# Patient Record
Sex: Female | Born: 1965 | Race: White | Hispanic: No | Marital: Married | Smoking: Current every day smoker
Health system: Southern US, Community
[De-identification: ages and names within clinical notes are randomized; demographics above are authoritative.]

## PROBLEM LIST (undated history)

## (undated) DIAGNOSIS — I10 Essential (primary) hypertension: Secondary | ICD-10-CM

## (undated) HISTORY — PX: HYSTERECTOMY ABDOMINAL WITH SALPINGECTOMY: SHX6725

---

## 2001-01-13 ENCOUNTER — Ambulatory Visit (HOSPITAL_COMMUNITY): Admission: RE | Admit: 2001-01-13 | Discharge: 2001-01-14 | Payer: Self-pay | Admitting: Neurosurgery

## 2001-01-13 ENCOUNTER — Encounter: Payer: Self-pay | Admitting: Neurosurgery

## 2001-05-29 ENCOUNTER — Encounter: Admission: RE | Admit: 2001-05-29 | Discharge: 2001-05-29 | Payer: Self-pay | Admitting: Neurosurgery

## 2001-05-29 ENCOUNTER — Encounter: Payer: Self-pay | Admitting: Neurosurgery

## 2005-05-30 ENCOUNTER — Emergency Department (HOSPITAL_COMMUNITY): Admission: EM | Admit: 2005-05-30 | Discharge: 2005-05-30 | Payer: Self-pay | Admitting: Emergency Medicine

## 2014-03-22 ENCOUNTER — Emergency Department (HOSPITAL_COMMUNITY)
Admission: EM | Admit: 2014-03-22 | Discharge: 2014-03-23 | Disposition: A | Discharge status: Home / Self Care | Payer: BC Managed Care – PPO | Attending: Emergency Medicine | Admitting: Emergency Medicine

## 2014-03-22 ENCOUNTER — Emergency Department (HOSPITAL_COMMUNITY): Payer: BC Managed Care – PPO

## 2014-03-22 DIAGNOSIS — G8929 Other chronic pain: Secondary | ICD-10-CM | POA: Insufficient documentation

## 2014-03-22 DIAGNOSIS — M549 Dorsalgia, unspecified: Secondary | ICD-10-CM | POA: Insufficient documentation

## 2014-03-22 DIAGNOSIS — Z90710 Acquired absence of both cervix and uterus: Secondary | ICD-10-CM | POA: Insufficient documentation

## 2014-03-22 DIAGNOSIS — K5792 Diverticulitis of intestine, part unspecified, without perforation or abscess without bleeding: Secondary | ICD-10-CM

## 2014-03-22 DIAGNOSIS — K5732 Diverticulitis of large intestine without perforation or abscess without bleeding: Secondary | ICD-10-CM | POA: Insufficient documentation

## 2014-03-22 DIAGNOSIS — R300 Dysuria: Secondary | ICD-10-CM | POA: Insufficient documentation

## 2014-03-22 DIAGNOSIS — E119 Type 2 diabetes mellitus without complications: Secondary | ICD-10-CM | POA: Insufficient documentation

## 2014-03-22 DIAGNOSIS — R739 Hyperglycemia, unspecified: Secondary | ICD-10-CM

## 2014-03-22 LAB — CBG MONITORING, ED: Glucose-Capillary: 422 mg/dL — ABNORMAL HIGH (ref 70–99)

## 2014-03-22 MED ORDER — INSULIN ASPART 100 UNIT/ML ~~LOC~~ SOLN
8.0000 [IU] | Freq: Once | SUBCUTANEOUS | Status: AC
  Administered 2014-03-22: 8 [IU] via SUBCUTANEOUS
  Filled 2014-03-22: qty 1

## 2014-03-22 MED ORDER — SODIUM CHLORIDE 0.9 % IV SOLN
Freq: Once | INTRAVENOUS | Status: AC
  Administered 2014-03-22: 1000 mL via INTRAVENOUS

## 2014-03-22 NOTE — ED Notes (Signed)
Low abd pain with radiation to low back for 3 days. Seen at urgent care and was sent in here for high blood sugar

## 2014-03-22 NOTE — ED Provider Notes (Signed)
CSN: [id number]     Arrival date & time 03/22/14  2233 History   This [id number] was scribed for Geoffery Lyonsouglas Shabria Egley, MD by Leona CarryG. Clay Sherrill, ED Scribe. The patient was seen in [facility]. The patient's care was started at 11:21 PM.     Chief Complaint  Patient presents with  . Abdominal Pain    Patient is a 48 y.o. female presenting with abdominal pain. The history is provided by the patient. No language interpreter was used.  Abdominal Pain Pain location:  Suprapubic Pain severity:  Moderate Duration:  3 days Timing:  Constant Progression:  Unchanged Chronicity:  New Ineffective treatments:  None tried Associated symptoms: dysuria and fever   Associated symptoms: no diarrhea, no nausea and no vomiting   Risk factors: not elderly   Risk factors comment:  DM  HPI Comments: Laura Bond is a 48 y.o. female with a history of DM and hysterectomy who presents to the Emergency Department complaining of constant, suprapubic abdominal pain beginning three days ago. Patient reports associated dysuria and mild fever for the past two days. She reports that she has also had high blood sugar today. She reports chronic back pain. Patient denies nausea, vomiting, and diarrhea. Patient states that she takes Meformin and Amaryl for her DM.  PCP is Dr. Clarise CruzSeepe.      No past medical history on file. No past surgical history on file. No family history on file. History  Substance Use Topics  . Smoking status: Not on file  . Smokeless tobacco: Not on file  . Alcohol Use: Not on file   OB History   No data available     Review of Systems  Constitutional: Positive for fever.  Gastrointestinal: Positive for abdominal pain. Negative for nausea, vomiting and diarrhea.  Genitourinary: Positive for dysuria.  All other systems reviewed and are negative.     Allergies  Review of patient's allergies indicates no known allergies.  Home Medications   Prior to Admission medications   Not on File   Triage  Vitals: BP 157/84  Pulse 124  Temp(Src) 98.4 F (36.9 C) (Oral)  Resp 16  Ht 5\' 5"  (1.651 m)  Wt 235 lb (106.595 kg)  BMI 39.11 kg/m2  SpO2 99% Physical Exam  Nursing note and vitals reviewed. Constitutional: She is oriented to person, place, and time. She appears well-developed and well-nourished. No distress.  HENT:  Head: Normocephalic and atraumatic.  Eyes: Conjunctivae and EOM are normal.  Neck: Normal range of motion. Neck supple. No tracheal deviation present.  Cardiovascular: Normal rate.   Pulmonary/Chest: Effort normal. No respiratory distress.  Abdominal: Soft. Bowel sounds are normal. There is tenderness (suprapubic). There is no rebound and no guarding.  TTP in the suprapubic region and left flank.   Musculoskeletal: Normal range of motion.  Neurological: She is alert and oriented to person, place, and time.  Skin: Skin is warm and dry.  Psychiatric: She has a normal mood and affect. Her behavior is normal.    ED Course  Procedures (including critical care time) DIAGNOSTIC STUDIES: Oxygen Saturation is 99% on room air, normal by my interpretation.    COORDINATION OF CARE: 11:29 PM-Discussed treatment plan which includes abdominal CT and insulin with pt at bedside and pt agreed to plan.     Labs Review Labs Reviewed  CBG MONITORING, ED - Abnormal; Notable for the following:    Glucose-Capillary 422 (*)    All other components within normal limits    Imaging  Review No results found.   EKG Interpretation None      MDM   Final diagnoses:  None    Patient is a 48 year old female who was sent from urgent care for evaluation of lower Donald pain that has been worsening for the past 3 days. She had laboratory studies there which revealed no elevation of WBCs, electrolyte panel which was unremarkable with the exception of elevated glucose, and normal urinalysis. She was sent here for further evaluation of her abdominal pain and treatment of her  hyperglycemia.  Workup reveals diverticulitis on CT scan. Initial blood sugar was over 400 and was reduced into the 300s with fluids and IV insulin. I suspect her sugars have been running chronically high as she does not check her sugars at home. She is on oral hypoglycemic medication, however refuses to be started on insulin as her doctor is recommended in the past. I feel as though she is appropriate for discharge. I will treat with Cipro, Flagyl, and pain medication. I've also advised her to keep a record of her blood sugars so that she can discuss this with her primary Dr. and make appropriate adjustments for tighter control of her diabetes.  I personally performed the services described in this documentation, which was scribed in my presence. The recorded information has been reviewed and is accurate.      Geoffery Lyonsouglas Kendre Jacinto, MD 03/23/14 [id number]

## 2014-03-23 LAB — CBG MONITORING, ED: Glucose-Capillary: 358 mg/dL — ABNORMAL HIGH (ref 70–99)

## 2014-03-23 MED ORDER — METRONIDAZOLE 500 MG PO TABS
500.0000 mg | ORAL_TABLET | Freq: Once | ORAL | Status: AC
  Administered 2014-03-23: 500 mg via ORAL
  Filled 2014-03-23: qty 1

## 2014-03-23 MED ORDER — METRONIDAZOLE 500 MG PO TABS: 500.0000 mg | ORAL_TABLET | Freq: Three times a day (TID) | ORAL | Status: DC

## 2014-03-23 MED ORDER — OXYCODONE-ACETAMINOPHEN 5-325 MG PO TABS: 1.0000 | ORAL_TABLET | ORAL | Status: DC | PRN

## 2014-03-23 MED ORDER — CIPROFLOXACIN HCL 250 MG PO TABS
500.0000 mg | ORAL_TABLET | Freq: Once | ORAL | Status: AC
  Administered 2014-03-23: 500 mg via ORAL
  Filled 2014-03-23: qty 2

## 2014-03-23 MED ORDER — MORPHINE SULFATE 4 MG/ML IJ SOLN
4.0000 mg | Freq: Once | INTRAMUSCULAR | Status: AC
  Administered 2014-03-23: 4 mg via INTRAVENOUS
  Filled 2014-03-23: qty 1

## 2014-03-23 MED ORDER — ONDANSETRON HCL 4 MG/2ML IJ SOLN
4.0000 mg | Freq: Once | INTRAMUSCULAR | Status: AC
  Administered 2014-03-23: 4 mg via INTRAVENOUS
  Filled 2014-03-23: qty 2

## 2014-03-23 MED ORDER — CIPROFLOXACIN HCL 500 MG PO TABS: 500.0000 mg | ORAL_TABLET | Freq: Two times a day (BID) | ORAL | Status: AC

## 2014-03-23 NOTE — Discharge Instructions (Signed)
Cipro and Flagyl as prescribed. Percocet as prescribed as needed for pain.  Followup with your primary Dr. to discuss your blood sugars and how to obtain tighter control of your diabetes.  Return to the emergency department if you develop severe abdominal pain, high fever and vomiting, or bloody stool, or any other new and concerning symptoms.   Diverticulitis Diverticulitis is inflammation or infection of small pouches in your colon that form when you have a condition called diverticulosis. The pouches in your colon are called diverticula. Your colon, or large intestine, is where water is absorbed and stool is formed. Complications of diverticulitis can include:  Bleeding.  Severe infection.  Severe pain.  Perforation of your colon.  Obstruction of your colon. CAUSES  Diverticulitis is caused by bacteria. Diverticulitis happens when stool becomes trapped in diverticula. This allows bacteria to grow in the diverticula, which can lead to inflammation and infection. RISK FACTORS People with diverticulosis are at risk for diverticulitis. Eating a diet that does not include enough fiber from fruits and vegetables may make diverticulitis more likely to develop. SYMPTOMS  Symptoms of diverticulitis may include:  Abdominal pain and tenderness. The pain is normally located on the left side of the abdomen, but may occur in other areas.  Fever and chills.  Bloating.  Cramping.  Nausea.  Vomiting.  Constipation.  Diarrhea.  Blood in your stool. DIAGNOSIS  Your health care provider will ask you about your medical history and do a physical exam. You may need to have tests done because many medical conditions can cause the same symptoms as diverticulitis. Tests may include:  Blood tests.  Urine tests.  Imaging tests of the abdomen, including X-rays and CT scans. When your condition is under control, your health care provider may recommend that you have a colonoscopy. A  colonoscopy can show how severe your diverticula are and whether something else is causing your symptoms. TREATMENT  Most cases of diverticulitis are mild and can be treated at home. Treatment may include:  Taking over-the-counter pain medicines.  Following a clear liquid diet.  Taking antibiotic medicines by mouth for 7-10 days. More severe cases may be treated at a hospital. Treatment may include:  Not eating or drinking.  Taking prescription pain medicine.  Receiving antibiotic medicines through an IV tube.  Receiving fluids and nutrition through an IV tube.  Surgery. HOME CARE INSTRUCTIONS   Follow your health care provider's instructions carefully.  Follow a full liquid diet or other diet as directed by your health care provider. After your symptoms improve, your health care provider may tell you to change your diet. He or she may recommend you eat a high-fiber diet. Fruits and vegetables are good sources of fiber. Fiber makes it easier to pass stool.  Take fiber supplements or probiotics as directed by your health care provider.  Only take medicines as directed by your health care provider.  Keep all your follow-up appointments. SEEK MEDICAL CARE IF:   Your pain does not improve.  You have a hard time eating food.  Your bowel movements do not return to normal. SEEK IMMEDIATE MEDICAL CARE IF:   Your pain becomes worse.  Your symptoms do not get better.  Your symptoms suddenly get worse.  You have a fever.  You have repeated vomiting.  You have bloody or black, tarry stools. MAKE SURE YOU:   Understand these instructions.  Will watch your condition.  Will get help right away if you are not doing well or  get worse. Document Released: 06/26/2005 Document Revised: 09/21/2013 Document Reviewed: 08/11/2013 [facility] Patient Information 2015 [place], [place]. This information is not intended to replace advice given to you by your health care provider. Make sure  you discuss any questions you have with your health care provider.  Hyperglycemia Hyperglycemia occurs when the glucose (sugar) in your blood is too high. Hyperglycemia can happen for many reasons, but it most often happens to people who do not know they have diabetes or are not managing their diabetes properly.  CAUSES  Whether you have diabetes or not, there are other causes of hyperglycemia. Hyperglycemia can occur when you have diabetes, but it can also occur in other situations that you might not be as aware of, such as: Diabetes  If you have diabetes and are having problems controlling your blood glucose, hyperglycemia could occur because of some of the following reasons:  Not following your meal plan.  Not taking your diabetes medications or not taking it properly.  Exercising less or doing less activity than you normally do.  Being sick. Pre-diabetes  This cannot be ignored. Before people develop Type 2 diabetes, they almost always have "pre-diabetes." This is when your blood glucose levels are higher than normal, but not yet high enough to be diagnosed as diabetes. Research has shown that some long-term damage to the body, especially the heart and circulatory system, may already be occurring during pre-diabetes. If you take action to manage your blood glucose when you have pre-diabetes, you may delay or prevent Type 2 diabetes from developing. Stress  If you have diabetes, you may be "diet" controlled or on oral medications or insulin to control your diabetes. However, you may find that your blood glucose is higher than usual in the hospital whether you have diabetes or not. This is often referred to as "stress hyperglycemia." Stress can elevate your blood glucose. This happens because of hormones put out by the body during times of stress. If stress has been the cause of your high blood glucose, it can be followed regularly by your caregiver. That way he/she can make sure your  hyperglycemia does not continue to get worse or progress to diabetes. Steroids  Steroids are medications that act on the infection fighting system (immune system) to block inflammation or infection. One side effect can be a rise in blood glucose. Most people can produce enough extra insulin to allow for this rise, but for those who cannot, steroids make blood glucose levels go even higher. It is not unusual for steroid treatments to "uncover" diabetes that is developing. It is not always possible to determine if the hyperglycemia will go away after the steroids are stopped. A special blood test called an A1c is sometimes done to determine if your blood glucose was elevated before the steroids were started. SYMPTOMS  Thirsty.  Frequent urination.  Dry mouth.  Blurred vision.  Tired or fatigue.  Weakness.  Sleepy.  Tingling in feet or leg. DIAGNOSIS  Diagnosis is made by monitoring blood glucose in one or all of the following ways:  A1c test. This is a chemical found in your blood.  Fingerstick blood glucose monitoring.  Laboratory results. TREATMENT  First, knowing the cause of the hyperglycemia is important before the hyperglycemia can be treated. Treatment may include, but is not be limited to:  Education.  Change or adjustment in medications.  Change or adjustment in meal plan.  Treatment for an illness, infection, etc.  More frequent blood glucose monitoring.  Change  in exercise plan.  Decreasing or stopping steroids.  Lifestyle changes. HOME CARE INSTRUCTIONS   Test your blood glucose as directed.  Exercise regularly. Your caregiver will give you instructions about exercise. Pre-diabetes or diabetes which comes on with stress is helped by exercising.  Eat wholesome, balanced meals. Eat often and at regular, fixed times. Your caregiver or nutritionist will give you a meal plan to guide your sugar intake.  Being at an ideal weight is important. If needed,  losing as little as 10 to 15 pounds may help improve blood glucose levels. SEEK MEDICAL CARE IF:   You have questions about medicine, activity, or diet.  You continue to have symptoms (problems such as increased thirst, urination, or weight gain). SEEK IMMEDIATE MEDICAL CARE IF:   You are vomiting or have diarrhea.  Your breath smells fruity.  You are breathing faster or slower.  You are very sleepy or incoherent.  You have numbness, tingling, or pain in your feet or hands.  You have chest pain.  Your symptoms get worse even though you have been following your caregiver's orders.  If you have any other questions or concerns. Document Released: 03/12/2001 Document Revised: 12/09/2011 Document Reviewed: 01/13/2012 [facility] Patient Information 2015 [place]. This information is not intended to replace advice given to you by your health care provider. Make sure you discuss any questions you have with your health care provider.

## 2018-11-17 ENCOUNTER — Other Ambulatory Visit: Payer: Self-pay

## 2018-11-17 ENCOUNTER — Emergency Department (HOSPITAL_COMMUNITY): Payer: BLUE CROSS/BLUE SHIELD

## 2018-11-17 ENCOUNTER — Encounter (HOSPITAL_COMMUNITY): Payer: Self-pay

## 2018-11-17 ENCOUNTER — Emergency Department (HOSPITAL_COMMUNITY)
Admission: EM | Admit: 2018-11-17 | Discharge: 2018-11-17 | Disposition: A | Discharge status: Home / Self Care | Payer: BLUE CROSS/BLUE SHIELD | Attending: Emergency Medicine | Admitting: Emergency Medicine

## 2018-11-17 DIAGNOSIS — I10 Essential (primary) hypertension: Secondary | ICD-10-CM | POA: Insufficient documentation

## 2018-11-17 DIAGNOSIS — Z7982 Long term (current) use of aspirin: Secondary | ICD-10-CM | POA: Insufficient documentation

## 2018-11-17 DIAGNOSIS — E860 Dehydration: Secondary | ICD-10-CM | POA: Insufficient documentation

## 2018-11-17 DIAGNOSIS — F17210 Nicotine dependence, cigarettes, uncomplicated: Secondary | ICD-10-CM | POA: Insufficient documentation

## 2018-11-17 DIAGNOSIS — Z79899 Other long term (current) drug therapy: Secondary | ICD-10-CM | POA: Insufficient documentation

## 2018-11-17 DIAGNOSIS — R1032 Left lower quadrant pain: Secondary | ICD-10-CM | POA: Insufficient documentation

## 2018-11-17 DIAGNOSIS — Z7984 Long term (current) use of oral hypoglycemic drugs: Secondary | ICD-10-CM | POA: Insufficient documentation

## 2018-11-17 DIAGNOSIS — R1030 Lower abdominal pain, unspecified: Secondary | ICD-10-CM

## 2018-11-17 HISTORY — DX: Essential (primary) hypertension: I10

## 2018-11-17 LAB — URINALYSIS, ROUTINE W REFLEX MICROSCOPIC
Bilirubin Urine: NEGATIVE
GLUCOSE, UA: 50 mg/dL — AB
HGB URINE DIPSTICK: NEGATIVE
KETONES UR: NEGATIVE mg/dL
Leukocytes,Ua: NEGATIVE
Nitrite: NEGATIVE
Protein, ur: NEGATIVE mg/dL
Specific Gravity, Urine: >1.046 — ABNORMAL HIGH (ref 1.005–1.030)
pH: 5 (ref 5.0–8.0)

## 2018-11-17 LAB — CBC
HCT: 47.9 % — ABNORMAL HIGH (ref 36.0–46.0)
Hemoglobin: 15.4 g/dL — ABNORMAL HIGH (ref 12.0–15.0)
MCH: 28.4 pg (ref 26.0–34.0)
MCHC: 32.2 g/dL (ref 30.0–36.0)
MCV: 88.2 fL (ref 80.0–100.0)
NRBC: 0 % (ref 0.0–0.2)
Platelets: 275 10*3/uL (ref 150–400)
RBC: 5.43 MIL/uL — ABNORMAL HIGH (ref 3.87–5.11)
RDW: 12.4 % (ref 11.5–15.5)
WBC: 10.5 10*3/uL (ref 4.0–10.5)

## 2018-11-17 LAB — COMPREHENSIVE METABOLIC PANEL
ALK PHOS: 78 U/L (ref 38–126)
ALT: 15 U/L (ref 0–44)
ANION GAP: 12 (ref 5–15)
AST: 13 U/L — ABNORMAL LOW (ref 15–41)
Albumin: 4.2 g/dL (ref 3.5–5.0)
BUN: 21 mg/dL — ABNORMAL HIGH (ref 6–20)
CALCIUM: 9.4 mg/dL (ref 8.9–10.3)
CO2: 24 mmol/L (ref 22–32)
CREATININE: 0.83 mg/dL (ref 0.44–1.00)
Chloride: 94 mmol/L — ABNORMAL LOW (ref 98–111)
GFR calc Af Amer: >60 mL/min (ref >60)
Glucose, Bld: 253 mg/dL — ABNORMAL HIGH (ref 70–99)
Potassium: 4.2 mmol/L (ref 3.5–5.1)
Sodium: 130 mmol/L — ABNORMAL LOW (ref 135–145)
TOTAL PROTEIN: 7.7 g/dL (ref 6.5–8.1)
Total Bilirubin: 0.6 mg/dL (ref 0.3–1.2)

## 2018-11-17 LAB — LIPASE, BLOOD: Lipase: 22 U/L (ref 11–51)

## 2018-11-17 MED ORDER — HYDROMORPHONE HCL 1 MG/ML IJ SOLN
1.0000 mg | Freq: Once | INTRAMUSCULAR | Status: AC
  Administered 2018-11-17: 1 mg via INTRAVENOUS
  Filled 2018-11-17: qty 1

## 2018-11-17 MED ORDER — ONDANSETRON HCL 4 MG/2ML IJ SOLN
4.0000 mg | Freq: Once | INTRAMUSCULAR | Status: AC
  Administered 2018-11-17: 4 mg via INTRAVENOUS
  Filled 2018-11-17: qty 2

## 2018-11-17 MED ORDER — ONDANSETRON 4 MG PO TBDP: ORAL_TABLET | ORAL | 0 refills | Status: AC

## 2018-11-17 MED ORDER — IOHEXOL 300 MG/ML  SOLN
100.0000 mL | Freq: Once | INTRAMUSCULAR | Status: AC | PRN
  Administered 2018-11-17: 100 mL via INTRAVENOUS

## 2018-11-17 MED ORDER — HYDROCODONE-ACETAMINOPHEN 5-325 MG PO TABS
1.0000 | ORAL_TABLET | Freq: Four times a day (QID) | ORAL | 0 refills | Status: AC | PRN
Start: 2018-11-17 — End: ?

## 2018-11-17 NOTE — Discharge Instructions (Addendum)
Follow-up with Dr. Darrick Penna in a week for recheck

## 2018-11-17 NOTE — ED Triage Notes (Signed)
Patient is having abdominal pain that starts at her umbilicus and extends to the left side of her back. States she is nauseous from pain. NAD.

## 2018-11-17 NOTE — ED Provider Notes (Signed)
[facility] EMERGENCY DEPARTMENT Provider Note   CSN: [id number] Arrival date & time: 11/17/18  1558    History   Chief Complaint Chief Complaint  Patient presents with  . Abdominal Pain    HPI Laura Bond is a 53 y.o. female.     Patient complains of abdominal pain for over a week in the left lower quadrant  The history is provided by the patient. No language interpreter was used.  Abdominal Pain  Pain location:  LLQ Pain quality: aching   Pain radiates to:  Does not radiate Pain severity:  Moderate Onset quality:  Gradual Timing:  Constant Progression:  Waxing and waning Chronicity:  New Context: not alcohol use   Associated symptoms: no chest pain, no cough, no diarrhea, no fatigue and no hematuria     Past Medical History:  Diagnosis Date  . Hypertension     There are no active problems to display for this patient.   Past Surgical History:  Procedure Laterality Date  . HYSTERECTOMY ABDOMINAL WITH SALPINGECTOMY       OB History   No obstetric history on file.      Home Medications    Prior to Admission medications   Medication Sig Start Date End Date Taking? Authorizing Provider  ALPRAZolam Prudy Feeler) 0.5 MG tablet Take 0.5 mg by mouth 3 (three) times daily as needed for anxiety.   Yes [provider]  aspirin 81 MG tablet Take 81 mg by mouth daily.   Yes [provider]  ciprofloxacin (CIPRO) 500 MG tablet Take 1 tablet (500 mg total) by mouth 2 (two) times daily. One po bid x 7 days Patient taking differently: Take 500 mg by mouth daily. 12 day course starting on 11/08/2018 03/23/14  Yes Delo, Riley Lam, MD  cyclobenzaprine (FLEXERIL) 10 MG tablet Take 10 mg by mouth 3 (three) times daily as needed for muscle spasms.   Yes [provider]  lisinopril-hydrochlorothiazide (PRINZIDE,ZESTORETIC) 20-12.5 MG tablet Take 1 tablet by mouth daily.   Yes [provider]  metFORMIN (GLUCOPHAGE) 500 MG tablet Take 500 mg by mouth  daily.    Yes [provider]  polyethylene glycol powder (GLYCOLAX/MIRALAX) powder Take 17 g by mouth daily.   Yes [provider]  HYDROcodone-acetaminophen (NORCO/VICODIN) 5-325 MG tablet Take 1 tablet by mouth every 6 (six) hours as needed for moderate pain. 11/17/18   Bethann Berkshire, MD  ondansetron (ZOFRAN ODT) 4 MG disintegrating tablet 4mg  ODT q4 hours prn nausea/vomit 11/17/18   Bethann Berkshire, MD    Family History No family history on file.  Social History Social History   Tobacco Use  . Smoking status: Current Every Day Smoker    Packs/day: 0.50  . Smokeless tobacco: Never Used  Substance Use Topics  . Alcohol use: Yes    Comment: occasional   . Drug use: Yes    Types: Marijuana    Comment: Weekly      Allergies   Banana   Review of Systems Review of Systems  Constitutional: Negative for appetite change and fatigue.  HENT: Negative for congestion, ear discharge and sinus pressure.   Eyes: Negative for discharge.  Respiratory: Negative for cough.   Cardiovascular: Negative for chest pain.  Gastrointestinal: Positive for abdominal pain. Negative for diarrhea.  Genitourinary: Negative for frequency and hematuria.  Musculoskeletal: Negative for back pain.  Skin: Negative for rash.  Neurological: Negative for seizures and headaches.  Psychiatric/Behavioral: Negative for hallucinations.     Physical Exam  Updated Vital Signs BP 101/72   Pulse 100   Temp 97.8 F (36.6 C) (Oral)   Resp 17   Ht 5\' 4"  (1.626 m)   Wt 106.6 kg   SpO2 97%   BMI 40.34 kg/m   Physical Exam Vitals signs and nursing note reviewed.  Constitutional:      Appearance: She is well-developed.  HENT:     Head: Normocephalic.     Nose: Nose normal.  Eyes:     General: No scleral icterus.    Conjunctiva/sclera: Conjunctivae normal.  Neck:     Musculoskeletal: Neck supple.     Thyroid: No thyromegaly.  Cardiovascular:     Rate and Rhythm: Normal rate and regular  rhythm.     Heart sounds: No murmur. No friction rub. No gallop.   Pulmonary:     Breath sounds: No stridor. No wheezing or rales.  Chest:     Chest wall: No tenderness.  Abdominal:     General: There is no distension.     Tenderness: There is abdominal tenderness. There is no rebound.     Comments: Tender left lower quadrant  Musculoskeletal: Normal range of motion.  Lymphadenopathy:     Cervical: No cervical adenopathy.  Skin:    Findings: No erythema or rash.  Neurological:     Mental Status: She is oriented to person, place, and time.     Motor: No abnormal muscle tone.     Coordination: Coordination normal.  Psychiatric:        Behavior: Behavior normal.      ED Treatments / Results  Labs (all labs ordered are listed, but only abnormal results are displayed) Labs Reviewed  COMPREHENSIVE METABOLIC PANEL - Abnormal; Notable for the following components:      Result Value   Sodium 130 (*)    Chloride 94 (*)    Glucose, Bld 253 (*)    BUN 21 (*)    AST 13 (*)    All other components within normal limits  CBC - Abnormal; Notable for the following components:   RBC 5.43 (*)    Hemoglobin 15.4 (*)    HCT 47.9 (*)    All other components within normal limits  URINALYSIS, ROUTINE W REFLEX MICROSCOPIC - Abnormal; Notable for the following components:   APPearance HAZY (*)    Specific Gravity, Urine >1.046 (*)    Glucose, UA 50 (*)    All other components within normal limits  LIPASE, BLOOD    EKG None  Radiology Ct Abdomen Pelvis W Contrast  Result Date: 11/17/2018 CLINICAL DATA:  Abdominal distension EXAM: CT ABDOMEN AND PELVIS WITH CONTRAST TECHNIQUE: Multidetector CT imaging of the abdomen and pelvis was performed using the standard protocol following bolus administration of intravenous contrast. CONTRAST:  OMNIPAQUE IOHEXOL 300 MG/ML  SOLN COMPARISON:  CT 03/22/2014 FINDINGS: Lower chest: Lung bases demonstrate no acute consolidation or effusion. The heart  size is normal. Hepatobiliary: No focal liver abnormality is seen. No gallstones, gallbladder wall thickening, or biliary dilatation. Pancreas: Unremarkable. No pancreatic ductal dilatation or surrounding inflammatory changes. Spleen: Normal in size without focal abnormality. Adrenals/Urinary Tract: Adrenal glands are unremarkable. Kidneys are normal, without renal calculi, focal lesion, or hydronephrosis. Bladder is unremarkable. Stomach/Bowel: Stomach is within normal limits. Appendix appears normal. No evidence of bowel wall thickening, distention, or inflammatory changes. Sigmoid colon diverticula without acute inflammatory process. Vascular/Lymphatic: Mild aortic atherosclerosis without aneurysm. No significantly enlarged lymph nodes. Reproductive: Status post hysterectomy. No adnexal  masses. Other: Negative for free air or free fluid. Small fat containing umbilical hernia Musculoskeletal: Degenerative changes. No acute or suspicious abnormality. IMPRESSION: 1. No CT evidence for acute intra-abdominal or pelvic abnormality. 2. Sigmoid colon diverticula without acute inflammatory process 3. Small fat containing umbilical hernia Electronically Signed   By: Jasmine PangKim  Fujinaga M.D.   On: 11/17/2018 19:46    Procedures Procedures (including critical care time)  Medications Ordered in ED Medications  HYDROmorphone (DILAUDID) injection 1 mg (has no administration in time range)  ondansetron (ZOFRAN) injection 4 mg (has no administration in time range)  HYDROmorphone (DILAUDID) injection 1 mg (1 mg Intravenous Given 11/17/18 1805)  ondansetron (ZOFRAN) injection 4 mg (4 mg Intravenous Given 11/17/18 1805)  iohexol (OMNIPAQUE) 300 MG/ML solution 100 mL (100 mLs Intravenous Contrast Given 11/17/18 1917)     Initial Impression / Assessment and Plan / ED Course  I have reviewed the triage vital signs and the nursing notes.  Pertinent labs & imaging results that were available during my care of the patient were  reviewed by me and considered in my medical decision making (see chart for details).    CT scan and labs unremarkable.  Urine shows some dehydration.  Patient given some pain medicine and will follow up with GI  Final Clinical Impressions(s) / ED Diagnoses   Final diagnoses:  Lower abdominal pain    ED Discharge Orders         Ordered    HYDROcodone-acetaminophen (NORCO/VICODIN) 5-325 MG tablet  Every 6 hours PRN     11/17/18 2121    ondansetron (ZOFRAN ODT) 4 MG disintegrating tablet     11/17/18 2121           Bethann BerkshireZammit, Nikolaos Maddocks, MD 11/17/18 2125

## 2018-11-30 ENCOUNTER — Encounter: Payer: Self-pay | Admitting: Internal Medicine

## 2019-02-04 ENCOUNTER — Ambulatory Visit: Payer: BLUE CROSS/BLUE SHIELD | Admitting: Gastroenterology

## 2020-10-05 IMAGING — CT CT ABD-PELV W/ CM
2 of 5 series · 16 of 46 positions shown, 18 images · IV contrast (Isovue)
Comparison: CT 03/22/2014

CLINICAL DATA: Abdominal distension

EXAM:
CT ABDOMEN AND PELVIS WITH CONTRAST
TECHNIQUE: Multidetector CT imaging of the abdomen and pelvis was performed
using the standard protocol following bolus administration of
intravenous contrast.
CONTRAST:  100mL OMNIPAQUE IOHEXOL 300 MG/ML  SOLN

[Series 2: axial st · axial · 0.76mm/px · z∈[-357,+93]mm · 13 of 102 slices shown, 15 images]
[im 6/102  soft-tissue]
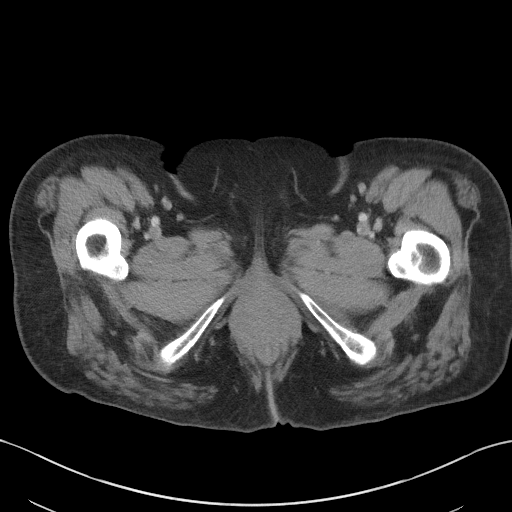
[im 6/102  bone]
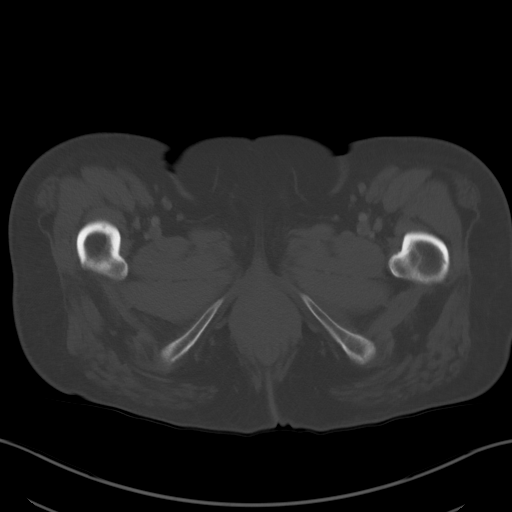
[im 16/102  soft-tissue]
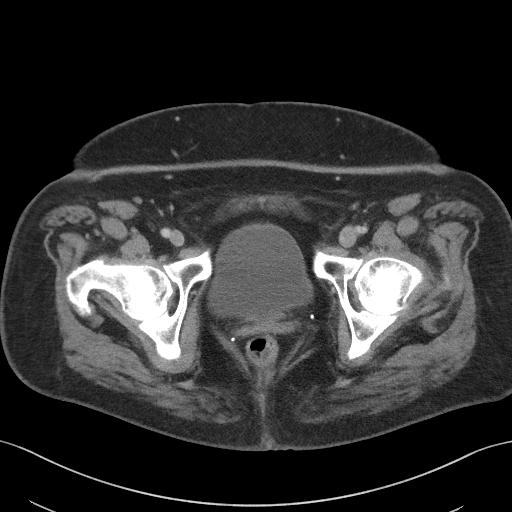
[im 22/102  soft-tissue]
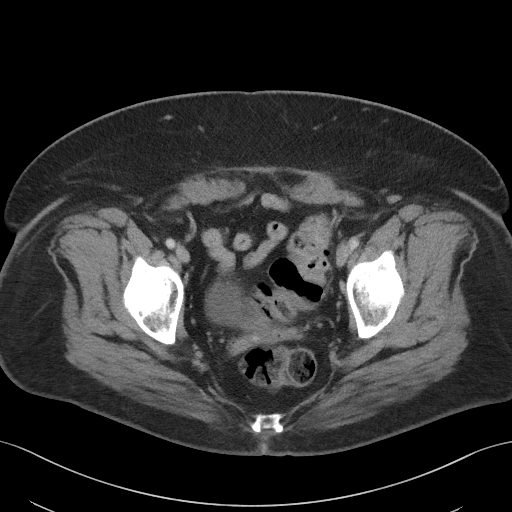
[im 27/102  soft-tissue]
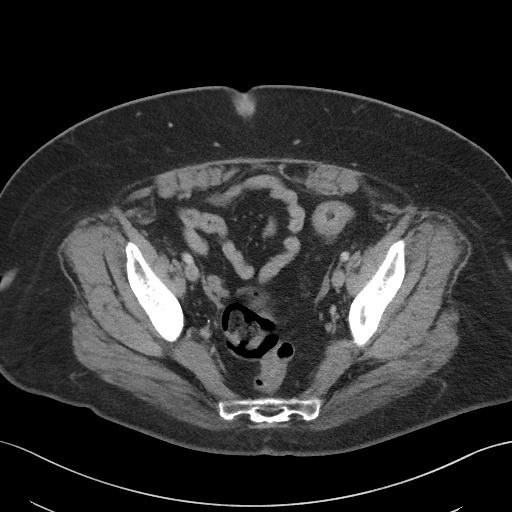
[im 38/102  soft-tissue]
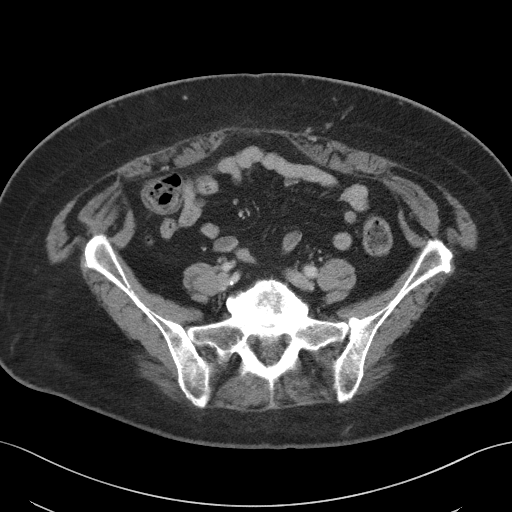
[im 43/102  soft-tissue]
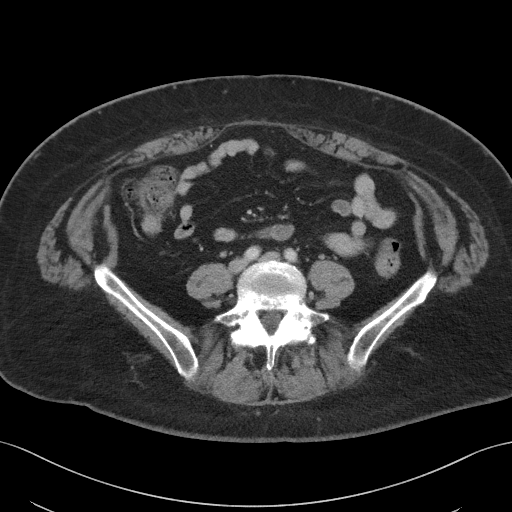
[im 54/102  soft-tissue]
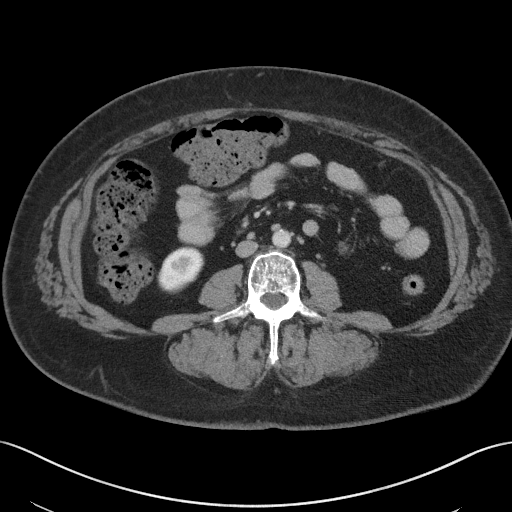
[im 59/102  soft-tissue]
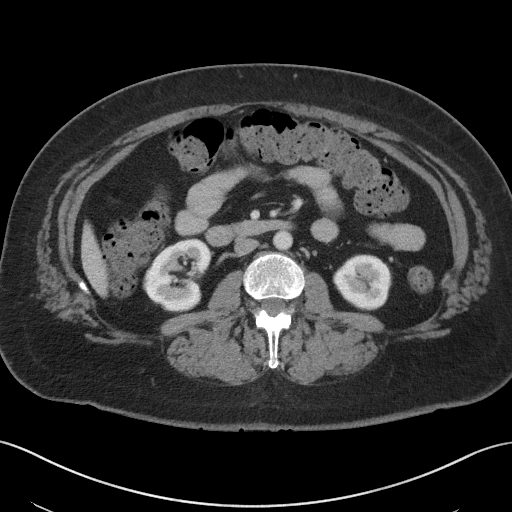
[im 64/102  soft-tissue]
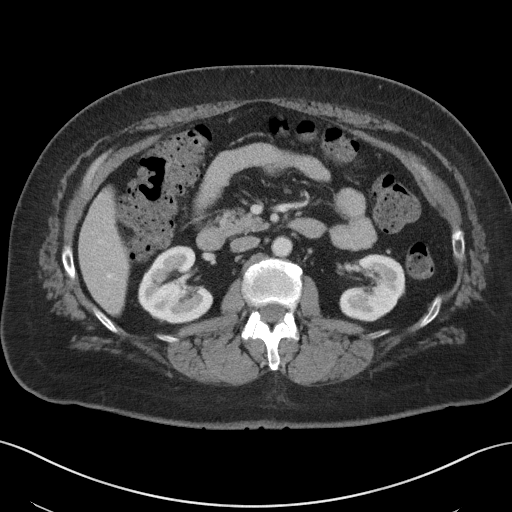
[im 64/102  bone]
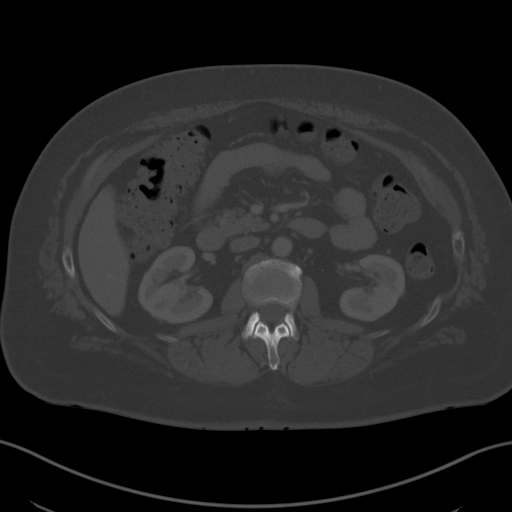
[im 75/102  soft-tissue]
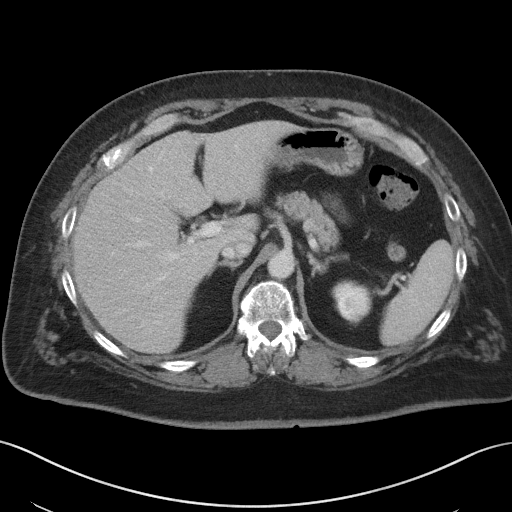
[im 80/102  soft-tissue]
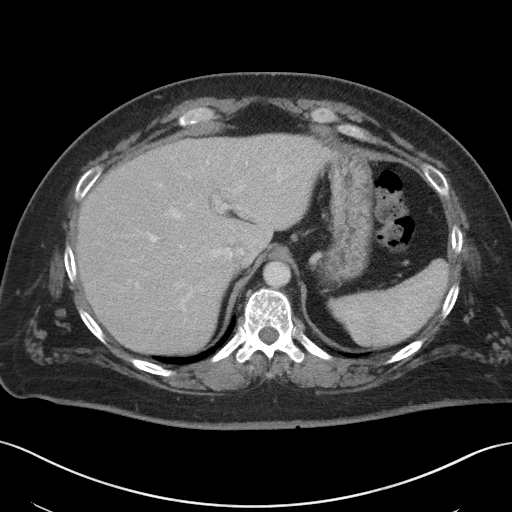
[im 86/102  soft-tissue]
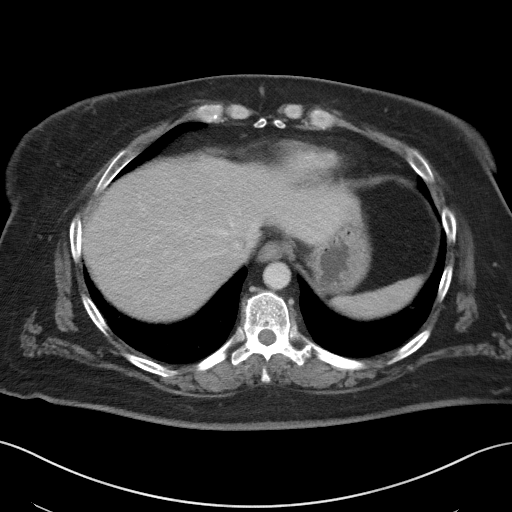
[im 96/102  soft-tissue]
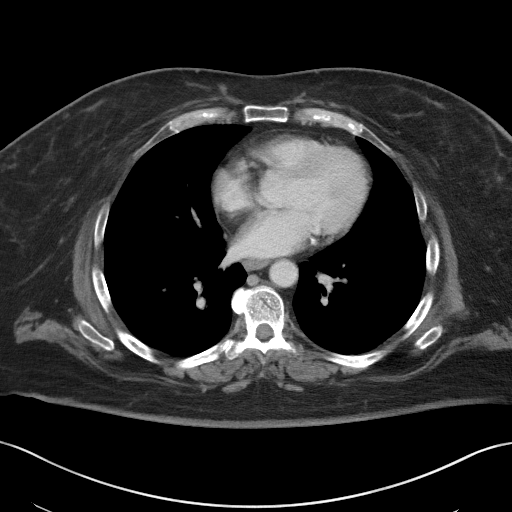

[Series 5: coronal st · coronal · 0.78mm/px · 3 of 90 slices shown]
[im 30/90  soft-tissue]
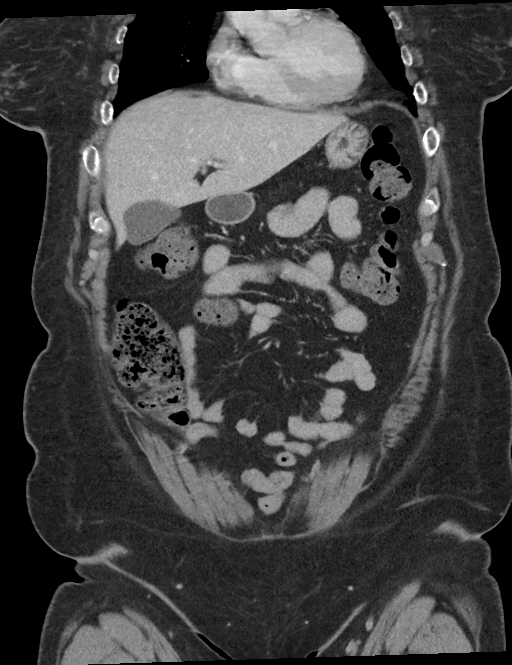
[im 40/90  soft-tissue]
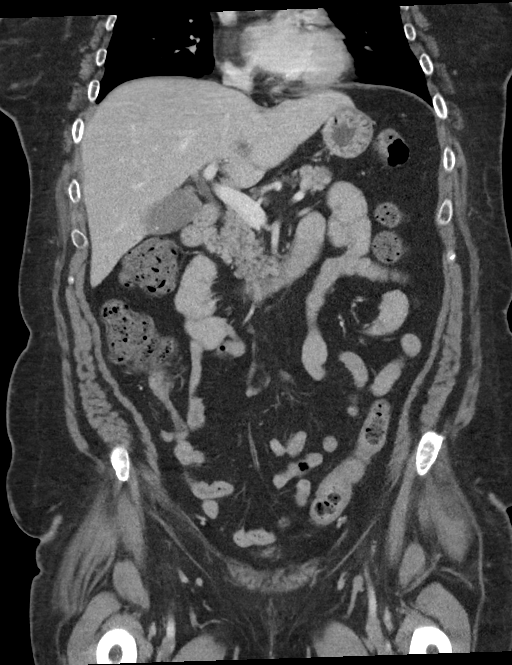
[im 50/90  soft-tissue]
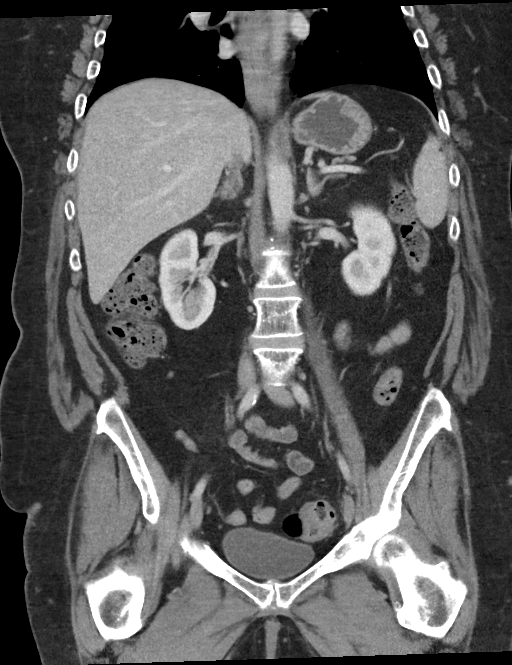

[16 of 46 positions shown; findings below may reference images not displayed]

FINDINGS: Lower chest: Lung bases demonstrate no acute consolidation or
effusion. The heart size is normal.

Hepatobiliary: No focal liver abnormality is seen. No gallstones,
gallbladder wall thickening, or biliary dilatation.

Pancreas: Unremarkable. No pancreatic ductal dilatation or
surrounding inflammatory changes.

Spleen: Normal in size without focal abnormality.

Adrenals/Urinary Tract: Adrenal glands are unremarkable. Kidneys are
normal, without renal calculi, focal lesion, or hydronephrosis.
Bladder is unremarkable.

Stomach/Bowel: Stomach is within normal limits. Appendix appears
normal. No evidence of bowel wall thickening, distention, or
inflammatory changes. Sigmoid colon diverticula without acute
inflammatory process.

Vascular/Lymphatic: Mild aortic atherosclerosis without aneurysm. No
significantly enlarged lymph nodes.

Reproductive: Status post hysterectomy. No adnexal masses.

Other: Negative for free air or free fluid. Small fat containing
umbilical hernia

Musculoskeletal: Degenerative changes. No acute or suspicious
abnormality.
IMPRESSION: 1. No CT evidence for acute intra-abdominal or pelvic abnormality.
2. Sigmoid colon diverticula without acute inflammatory process
3. Small fat containing umbilical hernia
